# Patient Record
Sex: Male | Born: 2000 | Race: White | Hispanic: No | Marital: Single | Smoking: Never smoker
Health system: Southern US, Community
[De-identification: ages and names within clinical notes are randomized; demographics above are authoritative.]

---

## 2019-01-03 ENCOUNTER — Other Ambulatory Visit: Payer: Self-pay

## 2019-01-03 ENCOUNTER — Encounter: Payer: Self-pay | Admitting: Emergency Medicine

## 2019-01-03 ENCOUNTER — Emergency Department: Payer: Managed Care, Other (non HMO)

## 2019-01-03 ENCOUNTER — Emergency Department
Admission: EM | Admit: 2019-01-03 | Discharge: 2019-01-03 | Disposition: A | Discharge status: Home / Self Care | Payer: Managed Care, Other (non HMO) | Attending: Emergency Medicine | Admitting: Emergency Medicine

## 2019-01-03 DIAGNOSIS — Y92410 Unspecified street and highway as the place of occurrence of the external cause: Secondary | ICD-10-CM | POA: Diagnosis not present

## 2019-01-03 DIAGNOSIS — R112 Nausea with vomiting, unspecified: Secondary | ICD-10-CM | POA: Insufficient documentation

## 2019-01-03 DIAGNOSIS — S060X9A Concussion with loss of consciousness of unspecified duration, initial encounter: Secondary | ICD-10-CM | POA: Diagnosis not present

## 2019-01-03 DIAGNOSIS — Y999 Unspecified external cause status: Secondary | ICD-10-CM | POA: Insufficient documentation

## 2019-01-03 DIAGNOSIS — R51 Headache: Secondary | ICD-10-CM | POA: Diagnosis present

## 2019-01-03 DIAGNOSIS — Y9389 Activity, other specified: Secondary | ICD-10-CM | POA: Insufficient documentation

## 2019-01-03 MED ORDER — ACETAMINOPHEN 500 MG PO TABS
1000.0000 mg | ORAL_TABLET | Freq: Once | ORAL | Status: AC
  Administered 2019-01-03: 1000 mg via ORAL
  Filled 2019-01-03: qty 2

## 2019-01-03 MED ORDER — ONDANSETRON 4 MG PO TBDP: 4.0000 mg | ORAL_TABLET | Freq: Three times a day (TID) | ORAL | 0 refills | Status: DC | PRN

## 2019-01-03 MED ORDER — ONDANSETRON 4 MG PO TBDP: 4.0000 mg | ORAL_TABLET | Freq: Three times a day (TID) | ORAL | 0 refills | Status: AC | PRN

## 2019-01-03 MED ORDER — ONDANSETRON 4 MG PO TBDP
4.0000 mg | ORAL_TABLET | Freq: Once | ORAL | Status: AC
  Administered 2019-01-03: 4 mg via ORAL
  Filled 2019-01-03: qty 1

## 2019-01-03 NOTE — ED Notes (Signed)
Says he felt a little nausea now.  Gave him crackers and ginger ale to try.  He got up to bathroom as well.
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
## 2019-01-03 NOTE — ED Notes (Signed)
Sleepy, but easily arousable. s ays he wants to go home.  He does not have any nausea and no further vomiting since arrival.
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
